# Patient Record
Sex: Female | Born: 2000 | Race: White | Hispanic: No | Marital: Single | Smoking: Never smoker
Health system: Southern US, Community
[De-identification: ages and names within clinical notes are randomized; demographics above are authoritative.]

## PROBLEM LIST (undated history)

## (undated) DIAGNOSIS — F909 Attention-deficit hyperactivity disorder, unspecified type: Secondary | ICD-10-CM

## (undated) DIAGNOSIS — R519 Headache, unspecified: Secondary | ICD-10-CM

## (undated) DIAGNOSIS — R51 Headache: Secondary | ICD-10-CM

## (undated) DIAGNOSIS — F32A Depression, unspecified: Secondary | ICD-10-CM

## (undated) DIAGNOSIS — H539 Unspecified visual disturbance: Secondary | ICD-10-CM

## (undated) DIAGNOSIS — F419 Anxiety disorder, unspecified: Secondary | ICD-10-CM

## (undated) DIAGNOSIS — F329 Major depressive disorder, single episode, unspecified: Secondary | ICD-10-CM

## (undated) HISTORY — PX: DENTAL SURGERY: SHX609

---
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
## 2022-07-25 ENCOUNTER — Telehealth: Payer: Self-pay

## 2022-07-25 NOTE — Transitions of Care (Post Inpatient/ED Visit) (Signed)
   07/25/2022  Name: Veronica Caldwell MRN: [id number] DOB: 2000-12-06  Today's TOC FU Call Status: Today's TOC FU Call Status:: Successful TOC FU Call Competed TOC FU Call Complete Date: 07/25/22  Transition Care Management Follow-up Telephone Call Date of Discharge: 07/23/22 Discharge Facility: Other ([place]) Name of Other (Non-[facility]) Discharge Facility: [facility] Type of Discharge: Inpatient Admission Primary Inpatient Discharge Diagnosis:: depression How have you been since you were released from the hospital?: Better Any questions or concerns?: No  Items Reviewed: Did you receive and understand the discharge instructions provided?: Yes Medications obtained and verified?: Yes (Medications Reviewed) Any new allergies since your discharge?: No Dietary orders reviewed?: NA Do you have support at home?: Yes People in Home: parent(s)  Home Care and Equipment/Supplies: [place] Ordered?: NA Any new equipment or medical supplies ordered?: NA  Functional Questionnaire: Do you need assistance with bathing/showering or dressing?: No Do you need assistance with meal preparation?: No Do you need assistance with eating?: No Do you have difficulty maintaining continence: No Do you need assistance with getting out of bed/getting out of a chair/moving?: No Do you have difficulty managing or taking your medications?: No  Follow up appointments reviewed: PCP Follow-up appointment confirmed?: No (no avail appt times, sent message to staff to schedule) MD Provider Line Number:[phone] Given: No [facility] Follow-up appointment confirmed?: NA Do you need transportation to your follow-up appointment?: No Do you understand care options if your condition(s) worsen?: Yes-patient verbalized understanding    [place], [place] Nurse Health Advisor Direct Dial [phone]
